# Patient Record
Sex: Male | Born: 1990 | Race: Black or African American | Hispanic: No | Marital: Single | Smoking: Current every day smoker
Health system: Southern US, Community
[De-identification: ages and names within clinical notes are randomized; demographics above are authoritative.]

## PROBLEM LIST (undated history)

## (undated) DIAGNOSIS — S63056A Dislocation of other carpometacarpal joint of unspecified hand, initial encounter: Secondary | ICD-10-CM

## (undated) DIAGNOSIS — S62309A Unspecified fracture of unspecified metacarpal bone, initial encounter for closed fracture: Secondary | ICD-10-CM

---

## 2016-12-27 DIAGNOSIS — S63056A Dislocation of other carpometacarpal joint of unspecified hand, initial encounter: Secondary | ICD-10-CM

## 2016-12-27 DIAGNOSIS — S62309A Unspecified fracture of unspecified metacarpal bone, initial encounter for closed fracture: Secondary | ICD-10-CM

## 2016-12-27 HISTORY — DX: Dislocation of other carpometacarpal joint of unspecified hand, initial encounter: S63.056A

## 2016-12-27 HISTORY — DX: Unspecified fracture of unspecified metacarpal bone, initial encounter for closed fracture: S62.309A

## 2017-01-04 ENCOUNTER — Other Ambulatory Visit: Payer: Self-pay | Admitting: Orthopedic Surgery

## 2017-01-04 ENCOUNTER — Encounter (HOSPITAL_BASED_OUTPATIENT_CLINIC_OR_DEPARTMENT_OTHER): Payer: Self-pay | Admitting: *Deleted

## 2017-01-05 ENCOUNTER — Ambulatory Visit (HOSPITAL_BASED_OUTPATIENT_CLINIC_OR_DEPARTMENT_OTHER)
Admission: RE | Admit: 2017-01-05 | Discharge: 2017-01-05 | Disposition: A | Discharge status: Home / Self Care | Payer: Self-pay | Source: Ambulatory Visit | Attending: Orthopedic Surgery | Admitting: Orthopedic Surgery

## 2017-01-05 ENCOUNTER — Ambulatory Visit (HOSPITAL_BASED_OUTPATIENT_CLINIC_OR_DEPARTMENT_OTHER): Payer: Self-pay | Admitting: Anesthesiology

## 2017-01-05 ENCOUNTER — Encounter (HOSPITAL_BASED_OUTPATIENT_CLINIC_OR_DEPARTMENT_OTHER): Payer: Self-pay

## 2017-01-05 ENCOUNTER — Ambulatory Visit (HOSPITAL_COMMUNITY): Payer: Self-pay

## 2017-01-05 ENCOUNTER — Encounter (HOSPITAL_BASED_OUTPATIENT_CLINIC_OR_DEPARTMENT_OTHER)
Admission: RE | Disposition: A | Discharge status: Home / Self Care | Payer: Self-pay | Source: Ambulatory Visit | Attending: Orthopedic Surgery

## 2017-01-05 DIAGNOSIS — F17210 Nicotine dependence, cigarettes, uncomplicated: Secondary | ICD-10-CM | POA: Insufficient documentation

## 2017-01-05 DIAGNOSIS — T148XXA Other injury of unspecified body region, initial encounter: Secondary | ICD-10-CM

## 2017-01-05 DIAGNOSIS — Y929 Unspecified place or not applicable: Secondary | ICD-10-CM | POA: Insufficient documentation

## 2017-01-05 DIAGNOSIS — S63054A Dislocation of other carpometacarpal joint of right hand, initial encounter: Secondary | ICD-10-CM | POA: Insufficient documentation

## 2017-01-05 DIAGNOSIS — W2209XA Striking against other stationary object, initial encounter: Secondary | ICD-10-CM | POA: Insufficient documentation

## 2017-01-05 DIAGNOSIS — S62324A Displaced fracture of shaft of fourth metacarpal bone, right hand, initial encounter for closed fracture: Secondary | ICD-10-CM | POA: Insufficient documentation

## 2017-01-05 HISTORY — PX: OPEN REDUCTION INTERNAL FIXATION (ORIF) METACARPAL: SHX6234

## 2017-01-05 HISTORY — DX: Dislocation of other carpometacarpal joint of unspecified hand, initial encounter: S63.056A

## 2017-01-05 HISTORY — DX: Unspecified fracture of unspecified metacarpal bone, initial encounter for closed fracture: S62.309A

## 2017-01-05 SURGERY — OPEN REDUCTION INTERNAL FIXATION (ORIF) METACARPAL
Anesthesia: General | Site: Hand | Laterality: Right

## 2017-01-05 MED ORDER — BUPIVACAINE-EPINEPHRINE 0.5% -1:200000 IJ SOLN
INTRAMUSCULAR | Status: DC | PRN
  Administered 2017-01-05: 5 mL

## 2017-01-05 MED ORDER — HYDROMORPHONE HCL 1 MG/ML IJ SOLN: 0.2500 mg | INTRAMUSCULAR | Status: DC | PRN

## 2017-01-05 MED ORDER — KETOROLAC TROMETHAMINE 30 MG/ML IJ SOLN: 30.0000 mg | Freq: Once | INTRAMUSCULAR | Status: DC | PRN

## 2017-01-05 MED ORDER — PROPOFOL 10 MG/ML IV BOLUS
INTRAVENOUS | Status: DC | PRN
  Administered 2017-01-05: 200 mg via INTRAVENOUS

## 2017-01-05 MED ORDER — CEFAZOLIN SODIUM-DEXTROSE 2-4 GM/100ML-% IV SOLN: 2.0000 g | INTRAVENOUS | Status: DC

## 2017-01-05 MED ORDER — CEFAZOLIN SODIUM-DEXTROSE 2-4 GM/100ML-% IV SOLN
INTRAVENOUS | Status: AC
  Filled 2017-01-05: qty 100

## 2017-01-05 MED ORDER — 0.9 % SODIUM CHLORIDE (POUR BTL) OPTIME
TOPICAL | Status: DC | PRN
  Administered 2017-01-05: 200 mL

## 2017-01-05 MED ORDER — DEXAMETHASONE SODIUM PHOSPHATE 4 MG/ML IJ SOLN
INTRAMUSCULAR | Status: DC | PRN
  Administered 2017-01-05: 10 mg via INTRAVENOUS

## 2017-01-05 MED ORDER — ACETAMINOPHEN 325 MG PO TABS: 650.0000 mg | ORAL_TABLET | Freq: Four times a day (QID) | ORAL | Status: AC | PRN

## 2017-01-05 MED ORDER — ONDANSETRON HCL 4 MG/2ML IJ SOLN
INTRAMUSCULAR | Status: AC
  Filled 2017-01-05: qty 2

## 2017-01-05 MED ORDER — MIDAZOLAM HCL 2 MG/2ML IJ SOLN
INTRAMUSCULAR | Status: AC
  Filled 2017-01-05: qty 2

## 2017-01-05 MED ORDER — ACETAMINOPHEN 10 MG/ML IV SOLN: 1000.0000 mg | Freq: Once | INTRAVENOUS | Status: DC | PRN

## 2017-01-05 MED ORDER — LIDOCAINE HCL (PF) 1 % IJ SOLN
INTRAMUSCULAR | Status: DC | PRN
  Administered 2017-01-05: 5 mL

## 2017-01-05 MED ORDER — DEXAMETHASONE SODIUM PHOSPHATE 10 MG/ML IJ SOLN
INTRAMUSCULAR | Status: AC
  Filled 2017-01-05: qty 1

## 2017-01-05 MED ORDER — OXYCODONE HCL 5 MG/5ML PO SOLN: 5.0000 mg | Freq: Once | ORAL | Status: DC | PRN

## 2017-01-05 MED ORDER — FENTANYL CITRATE (PF) 100 MCG/2ML IJ SOLN
50.0000 ug | INTRAMUSCULAR | Status: DC | PRN
  Administered 2017-01-05: 100 ug via INTRAVENOUS

## 2017-01-05 MED ORDER — OXYCODONE HCL 5 MG PO TABS: 5.0000 mg | ORAL_TABLET | Freq: Once | ORAL | Status: DC | PRN

## 2017-01-05 MED ORDER — OXYCODONE HCL 5 MG PO TABS: 5.0000 mg | ORAL_TABLET | Freq: Four times a day (QID) | ORAL | 0 refills | Status: AC | PRN

## 2017-01-05 MED ORDER — IBUPROFEN 200 MG PO TABS: 600.0000 mg | ORAL_TABLET | Freq: Four times a day (QID) | ORAL | Status: AC | PRN

## 2017-01-05 MED ORDER — LACTATED RINGERS IV SOLN
INTRAVENOUS | Status: DC
  Administered 2017-01-05: 13:00:00 via INTRAVENOUS

## 2017-01-05 MED ORDER — LIDOCAINE HCL (PF) 1 % IJ SOLN
INTRAMUSCULAR | Status: AC
  Filled 2017-01-05: qty 30

## 2017-01-05 MED ORDER — LACTATED RINGERS IV SOLN
INTRAVENOUS | Status: DC
  Administered 2017-01-05: 15:00:00 via INTRAVENOUS

## 2017-01-05 MED ORDER — LIDOCAINE HCL (CARDIAC) 20 MG/ML IV SOLN
INTRAVENOUS | Status: AC
  Filled 2017-01-05: qty 5

## 2017-01-05 MED ORDER — SCOPOLAMINE 1 MG/3DAYS TD PT72: 1.0000 | MEDICATED_PATCH | Freq: Once | TRANSDERMAL | Status: DC | PRN

## 2017-01-05 MED ORDER — LIDOCAINE 2% (20 MG/ML) 5 ML SYRINGE
INTRAMUSCULAR | Status: DC | PRN
  Administered 2017-01-05: 100 mg via INTRAVENOUS

## 2017-01-05 MED ORDER — FENTANYL CITRATE (PF) 100 MCG/2ML IJ SOLN
INTRAMUSCULAR | Status: AC
  Filled 2017-01-05: qty 2

## 2017-01-05 MED ORDER — MIDAZOLAM HCL 2 MG/2ML IJ SOLN
1.0000 mg | INTRAMUSCULAR | Status: DC | PRN
  Administered 2017-01-05: 2 mg via INTRAVENOUS

## 2017-01-05 MED ORDER — PROMETHAZINE HCL 25 MG/ML IJ SOLN: 6.2500 mg | INTRAMUSCULAR | Status: DC | PRN

## 2017-01-05 MED ORDER — MEPERIDINE HCL 25 MG/ML IJ SOLN: 6.2500 mg | INTRAMUSCULAR | Status: DC | PRN

## 2017-01-05 SURGICAL SUPPLY — 48 items
BLADE MINI RND TIP GREEN BEAV (BLADE) IMPLANT
BLADE SURG 15 STRL LF DISP TIS (BLADE) ×1 IMPLANT
BLADE SURG 15 STRL SS (BLADE) ×2
BNDG COHESIVE 4X5 TAN STRL (GAUZE/BANDAGES/DRESSINGS) ×3 IMPLANT
BNDG ESMARK 4X9 LF (GAUZE/BANDAGES/DRESSINGS) IMPLANT
BNDG GAUZE ELAST 4 BULKY (GAUZE/BANDAGES/DRESSINGS) ×3 IMPLANT
CANISTER SUCTION 1200CC (MISCELLANEOUS) IMPLANT
CHLORAPREP W/TINT 26ML (MISCELLANEOUS) ×3 IMPLANT
CORD BIPOLAR FORCEPS 12FT (ELECTRODE) ×3 IMPLANT
COVER BACK TABLE 60X90IN (DRAPES) ×3 IMPLANT
COVER MAYO STAND STRL (DRAPES) ×3 IMPLANT
CUFF TOURNIQUET SINGLE 18IN (TOURNIQUET CUFF) ×3 IMPLANT
DRAPE C-ARM 42X72 X-RAY (DRAPES) ×3 IMPLANT
DRAPE EXTREMITY T 121X128X90 (DRAPE) ×3 IMPLANT
DRAPE SURG 17X23 STRL (DRAPES) ×3 IMPLANT
DRSG EMULSION OIL 3X3 NADH (GAUZE/BANDAGES/DRESSINGS) ×3 IMPLANT
GAUZE SPONGE 4X4 12PLY STRL LF (GAUZE/BANDAGES/DRESSINGS) ×3 IMPLANT
GLOVE BIO SURGEON STRL SZ7.5 (GLOVE) ×3 IMPLANT
GLOVE BIOGEL PI IND STRL 7.0 (GLOVE) ×1 IMPLANT
GLOVE BIOGEL PI IND STRL 8 (GLOVE) ×1 IMPLANT
GLOVE BIOGEL PI INDICATOR 7.0 (GLOVE) ×2
GLOVE BIOGEL PI INDICATOR 8 (GLOVE) ×2
GLOVE ECLIPSE 6.5 STRL STRAW (GLOVE) ×3 IMPLANT
GOWN STRL REUS W/ TWL LRG LVL3 (GOWN DISPOSABLE) ×1 IMPLANT
GOWN STRL REUS W/TWL LRG LVL3 (GOWN DISPOSABLE) ×2
GOWN STRL REUS W/TWL XL LVL3 (GOWN DISPOSABLE) ×3 IMPLANT
K-WIRE .062X4 (WIRE) ×6 IMPLANT
NEEDLE HYPO 25X1 1.5 SAFETY (NEEDLE) ×3 IMPLANT
NS IRRIG 1000ML POUR BTL (IV SOLUTION) ×3 IMPLANT
PACK BASIN DAY SURGERY FS (CUSTOM PROCEDURE TRAY) ×3 IMPLANT
PADDING CAST ABS 4INX4YD NS (CAST SUPPLIES) ×2
PADDING CAST ABS COTTON 4X4 ST (CAST SUPPLIES) ×1 IMPLANT
SLEEVE SCD COMPRESS KNEE MED (MISCELLANEOUS) IMPLANT
SPLINT PLASTER CAST XFAST 3X15 (CAST SUPPLIES) ×8 IMPLANT
SPLINT PLASTER XTRA FASTSET 3X (CAST SUPPLIES) ×16
STOCKINETTE 6  STRL (DRAPES) ×2
STOCKINETTE 6 STRL (DRAPES) ×1 IMPLANT
SUCTION FRAZIER HANDLE 10FR (MISCELLANEOUS)
SUCTION TUBE FRAZIER 10FR DISP (MISCELLANEOUS) IMPLANT
SUT VICRYL RAPIDE 4-0 (SUTURE) IMPLANT
SUT VICRYL RAPIDE 4/0 PS 2 (SUTURE) ×3 IMPLANT
SYR 10ML LL (SYRINGE) ×3 IMPLANT
SYR BULB 3OZ (MISCELLANEOUS) ×3 IMPLANT
TOWEL OR 17X24 6PK STRL BLUE (TOWEL DISPOSABLE) ×3 IMPLANT
TOWEL OR NON WOVEN STRL DISP B (DISPOSABLE) ×3 IMPLANT
TUBE CONNECTING 20'X1/4 (TUBING)
TUBE CONNECTING 20X1/4 (TUBING) IMPLANT
UNDERPAD 30X30 (UNDERPADS AND DIAPERS) ×3 IMPLANT

## 2017-01-05 NOTE — Anesthesia Procedure Notes (Signed)
Procedure Name: LMA Insertion Date/Time: 01/05/2017 3:03 PM Performed by: Gar GibbonKEETON, Iyania Denne S Pre-anesthesia Checklist: Patient identified, Emergency Drugs available, Suction available and Patient being monitored Patient Re-evaluated:Patient Re-evaluated prior to inductionOxygen Delivery Method: Circle system utilized Preoxygenation: Pre-oxygenation with 100% oxygen Intubation Type: IV induction Ventilation: Mask ventilation without difficulty LMA: LMA inserted LMA Size: 4.0 Number of attempts: 1 Airway Equipment and Method: Bite block Placement Confirmation: positive ETCO2 Tube secured with: Tape Dental Injury: Teeth and Oropharynx as per pre-operative assessment

## 2017-01-05 NOTE — Interval H&P Note (Signed)
History and Physical Interval Note:  01/05/2017 1:18 PM  Richard Huynh  has presented today for surgery, with the diagnosis of RIGHT 4TH METACARPAL FRACTURE, 5TH [facility] S62.324A, [phone]  The various methods of treatment have been discussed with the patient and family. After consideration of risks, benefits and other options for treatment, the patient has consented to  Procedure(s): OPEN TREATMENT OF RIGHT 4TH METACARPAL FRACTURE, 5TH CMC DISLOCATION (Right) as a surgical intervention .  The patient's history has been reviewed, patient examined, no change in status, stable for surgery.  I have reviewed the patient's chart and labs.  Questions were answered to the patient's satisfaction.     Vilma Will A.

## 2017-01-05 NOTE — Op Note (Signed)
01/05/2017  2:36 PM  PATIENT:  Richard Huynh  26 y.o. male  PRE-OPERATIVE DIAGNOSIS:  R 4 MC fx and 5 CMC dx  POST-OPERATIVE DIAGNOSIS:  Same  PROCEDURE:  ORIF R 4 MC fx (IMN) and ORIF R 5 CMC dx  SURGEON: Cliffton Asters. Janee Morn, MD  PHYSICIAN ASSISTANT: None  ANESTHESIA:  general  SPECIMENS:  None  DRAINS:   None  EBL:  less than 50 mL  PREOPERATIVE INDICATIONS:  [name] is a  26 y.o. male with a R 4 MC fx and 5 CMC dx   The risks benefits and alternatives were discussed with the patient preoperatively including but not limited to the risks of infection, bleeding, nerve injury, cardiopulmonary complications, the need for revision surgery, among others, and the patient verbalized understanding and consented to proceed.  OPERATIVE IMPLANTS: 0.062 in k-wires   OPERATIVE PROCEDURE:  After receiving prophylactic antibiotics, the patient was escorted to the operative theatre and placed in a supine position.  General anesthesia was administered.  A surgical "time-out" was performed during which the planned procedure, proposed operative site, and the correct patient identity were compared to the operative consent and agreement confirmed by the circulating nurse according to current facility policy.  Following application of a tourniquet to the operative extremity, the exposed skin was prepped with Chloraprep and draped in the usual sterile fashion.   First, I addressed the fourth metacarpal fracture.  The base of the metacarpal was marked fluoroscopically and a small incision just a few millimeters in length was made through the skin.  Hemostat dissection was carried down to the dorsal surface of the fourth metacarpal were 0.062 inch K wire was used to create an oblique entry hole.  The K wire was then removed, the tip clipped off and bent into a skid shape and then advanced by hand in a 3. shock to the fracture site.  Attempts at closed reduction and passage of the intramedullary nail  proved unsuccessful and a small incision was made over the fracture site, where open reduction was performed.  A phalangeal clamp was used to hold the reduction and the nail was passed across the fracture site.  Alignment was excellent.  The nail was advanced into the head of the metacarpal and then was bent over 90/90 at the skin and clipped.  Attention was shifted to the fifth CMC dislocation.  Closed reduction was unsuccessful.  I made a small stab wound through which a couple of instruments were past trying to assist in reduction, and this was unsuccessful.  The tourniquet was then inflated to 250 mmHg and the small wound was extended to be about an inch long.  The [facility] joint was visualized directly and some of the dorsal capsule was pulled out of it, which allowed for reduction of the joint.  The reduction appeared to be anatomic.  It was secured with a 0.062 inch K wire driven from the base of the fifth into the hamate.  It was bent over at the skin and clipped.  Final fluoroscopic images were obtained.  The tourniquet was released, the wound irrigated, and the skin closed with 4-0 Vicryl Rapide interrupted and running suture.  A short arm splint dressing was applied with a volar plaster component and I performed an ulnar nerve block at the wrist to assist with postoperative pain control.  He was awakened and taken to the recovery room in stable condition, breathing spontaneously  DISPOSITION: He'll be discharged home today with  typical instructions, returning in 7-10 days, at which time he should have new x-rays of the right hand out of splint and conversion to short arm cast.

## 2017-01-05 NOTE — Transfer of Care (Signed)
Immediate Anesthesia Transfer of Care Note  Patient: Richard Huynh  Procedure(s) Performed: Procedure(s): OPEN TREATMENT OF RIGHT FOURTH METACARPAL FRACTURE, FIFTH CARPOMETACARPAL DISLOCATION (Right)  Patient Location: PACU  Anesthesia Type:General  Level of Consciousness: sedated and responds to stimulation  Airway & Oxygen Therapy: Patient Spontanous Breathing and Patient connected to face mask oxygen  Post-op Assessment: Report given to RN and Post -op Vital signs reviewed and stable  Post vital signs: Reviewed and stable  Last Vitals:  Vitals:   01/05/17 1247  BP: 108/70  Pulse: 65  Resp: 18  Temp: 36.8 C    Last Pain:  Vitals:   01/05/17 1318  PainSc: 6       Patients Stated Pain Goal: 1 (01/05/17 1318)  Complications: No apparent anesthesia complications

## 2017-01-05 NOTE — Discharge Instructions (Addendum)
Discharge Instructions ° ° °You have a dressing with a plaster splint incorporated in it. °Move your fingers as much as possible, making a full fist and fully opening the fist. °Elevate your hand to reduce pain & swelling of the digits.  Ice over the operative site may be helpful to reduce pain & swelling.  DO NOT USE HEAT. °Leave the dressing in place until you return to our office.  °You may shower, but keep the bandage clean & dry.  °You may drive a car when you are off of prescription pain medications and can safely control your vehicle with both hands. ° ° °Please call [phone] during normal business hours or [phone] after hours for any problems. Including the following: ° °- excessive redness of the incisions °- drainage for more than 4 days °- fever of more than 101.5 F ° °*Please note that pain medications will not be refilled after hours or on weekends. ° ° ° °Post Anesthesia Home Care Instructions ° °Activity: °Get plenty of rest for the remainder of the day. A responsible individual must stay with you for 24 hours following the procedure.  °For the next 24 hours, DO NOT: °-Drive a car °-Operate machinery °-Drink alcoholic beverages °-Take any medication unless instructed by your physician °-Make any legal decisions or sign important papers. ° °Meals: °Start with liquid foods such as gelatin or soup. Progress to regular foods as tolerated. Avoid greasy, spicy, heavy foods. If nausea and/or vomiting occur, drink only clear liquids until the nausea and/or vomiting subsides. Call your physician if vomiting continues. ° °Special Instructions/Symptoms: °Your throat may feel dry or sore from the anesthesia or the breathing tube placed in your throat during surgery. If this causes discomfort, gargle with warm salt water. The discomfort should disappear within 24 hours. ° °If you had a scopolamine patch placed behind your ear for the management of post- operative nausea and/or vomiting: ° °1. The  medication in the patch is effective for 72 hours, after which it should be removed.  Wrap patch in a tissue and discard in the trash. Wash hands thoroughly with soap and water. °2. You may remove the patch earlier than 72 hours if you experience unpleasant side effects which may include dry mouth, dizziness or visual disturbances. °3. Avoid touching the patch. Wash your hands with soap and water after contact with the patch. °  ° ° °

## 2017-01-05 NOTE — Anesthesia Preprocedure Evaluation (Signed)
Anesthesia Evaluation  Patient identified by MRN, date of birth, ID band Patient awake    Reviewed: Allergy & Precautions, NPO status , Patient's Chart, lab work & pertinent test results  Airway Mallampati: II  TM Distance: >3 FB Neck ROM: Full    Dental no notable dental hx.    Pulmonary Current Smoker,    Pulmonary exam normal breath sounds clear to auscultation       Cardiovascular negative cardio ROS Normal cardiovascular exam Rhythm:Regular Rate:Normal     Neuro/Psych negative neurological ROS  negative psych ROS   GI/Hepatic negative GI ROS, Neg liver ROS,   Endo/Other  negative endocrine ROS  Renal/GU negative Renal ROS     Musculoskeletal negative musculoskeletal ROS (+)   Abdominal   Peds  Hematology negative hematology ROS (+)   Anesthesia Other Findings   Reproductive/Obstetrics                             Anesthesia Physical Anesthesia Plan  ASA: II  Anesthesia Plan: General   Post-op Pain Management:    Induction: Intravenous  PONV Risk Score and Plan: 3 and Ondansetron, Dexamethasone, Propofol and Midazolam  Airway Management Planned: LMA  Additional Equipment:   Intra-op Plan:   Post-operative Plan: Extubation in OR  Informed Consent: I have reviewed the patients History and Physical, chart, labs and discussed the procedure including the risks, benefits and alternatives for the proposed anesthesia with the patient or authorized representative who has indicated his/her understanding and acceptance.   Dental advisory given  Plan Discussed with: CRNA  Anesthesia Plan Comments:         Anesthesia Quick Evaluation

## 2017-01-05 NOTE — H&P (Signed)
Richard Huynh is an 26 y.o. male.   CC / Reason for Visit: Right hand injury HPI: This patient presents for evaluation of his right hand.  It is my understanding that he injured it striking an immovable object.  [facility], referred for outpatient follow-up, returned to the emergency room, unable to obtain that follow-up and I was consulted when [facility] past Saturday.  He presents with a splint in place.  Past Medical History:  Diagnosis Date  . CMC (carpometacarpal joint) dislocation 12/2016   right 5th  . Metacarpal bone fracture 12/2016   right 4th    History reviewed. No pertinent surgical history.  History reviewed. No pertinent family history. Social History:  reports that he has been smoking Cigarettes.  He has a 10.00 pack-year smoking history. He has never used smokeless tobacco. He reports that he drinks alcohol. He reports that he does not use drugs.  Allergies: No Known Allergies  No prescriptions prior to admission.    No results found for this or any previous visit (from the past 48 hour(s)). No results found.  Review of Systems  All other systems reviewed and are negative.   Height 5\' 9"  (1.753 m), weight 79.4 kg (175 lb). Physical Exam  Constitutional:  WD, WN, NAD HEENT:  NCAT, EOMI Neuro/Psych:  Alert & oriented to person, place, and time; appropriate mood & affect Lymphatic: No generalized UE edema or lymphadenopathy Extremities / MSK:  Both UE are normal with respect to appearance, ranges of motion, joint stability, muscle strength/tone, sensation, & perfusion except as otherwise noted:  Right hand is in an ulnar gutter splint.  The tips of the digits are accessible, warm with brisk capillary refill, intact light touch sensibility across all of the tips.  Labs / Xrays:  No radiographic studies obtained today.  X-rays from 01-02-17 are reviewed on canopy and reveal a fourth metacarpal  midshaft fracture and a 5th CMC dislocation  Assessment: Subacute right hand injury, with fourth metacarpal shaft fracture and fifth CMC dislocation  Plan:  I discussed these findings with him and recommended operative treatment.  With regard to the fourth, hopefully I can do this with an intramedullary K wire.  We discussed reducing and pinning the fifth CMC as well.  This is tentatively scheduled for tomorrow.  The details of the operative procedure were discussed with the patient.  Questions were invited and answered.  In addition to the goal of the procedure, the risks of the procedure to include but not limited to bleeding; infection; damage to the nerves or blood vessels that could result in bleeding, numbness, weakness, chronic pain, and the need for additional procedures; stiffness; the need for revision surgery; and anesthetic risks were reviewed.  No specific outcome was guaranteed or implied.  Informed consent was obtained.  Richard Huynh A., MD 01/05/2017, 6:54 AM

## 2017-01-06 ENCOUNTER — Encounter (HOSPITAL_BASED_OUTPATIENT_CLINIC_OR_DEPARTMENT_OTHER): Payer: Self-pay | Admitting: Orthopedic Surgery

## 2017-01-06 NOTE — Anesthesia Postprocedure Evaluation (Signed)
Anesthesia Post Note  Patient: Richard Huynh  Procedure(s) Performed: Procedure(s) (LRB): OPEN TREATMENT OF RIGHT FOURTH METACARPAL FRACTURE, FIFTH CARPOMETACARPAL DISLOCATION (Right)     Patient location during evaluation: PACU Anesthesia Type: General Level of consciousness: sedated and patient cooperative Pain management: pain level controlled Vital Signs Assessment: post-procedure vital signs reviewed and stable Respiratory status: spontaneous breathing Cardiovascular status: stable Anesthetic complications: no    Last Vitals:  Vitals:   01/05/17 1645 01/05/17 1716  BP: 118/77 123/89  Pulse: (!) 58 (!) 56  Resp: 18 20  Temp:  36.4 C    Last Pain:  Vitals:   01/06/17 1112  PainSc: 0-No pain                 Lewie LoronJohn Dannae Kato

## 2017-12-16 ENCOUNTER — Ambulatory Visit (HOSPITAL_COMMUNITY)
Admission: RE | Admit: 2017-12-16 | Discharge: 2017-12-16 | Disposition: A | Discharge status: Home / Self Care | Payer: Self-pay | Source: Ambulatory Visit | Attending: Obstetrics & Gynecology | Admitting: Obstetrics & Gynecology

## 2017-12-16 DIAGNOSIS — Z029 Encounter for administrative examinations, unspecified: Secondary | ICD-10-CM | POA: Insufficient documentation

## 2018-07-02 IMAGING — RF DG C-ARM 61-120 MIN
1 series · 4 of 4 positions shown · non-contrast
Comparison: 12/30/2016.

CLINICAL DATA: 25-year-old male with right fourth metacarpal
fracture and right fifth metacarpal subluxation. Subsequent
encounter.

EXAM:
DG C-ARM 61-120 MIN; RIGHT HAND - COMPLETE 3+ VIEW

[Series 1: run · 4 of 4 slices shown]
[im 1/4]
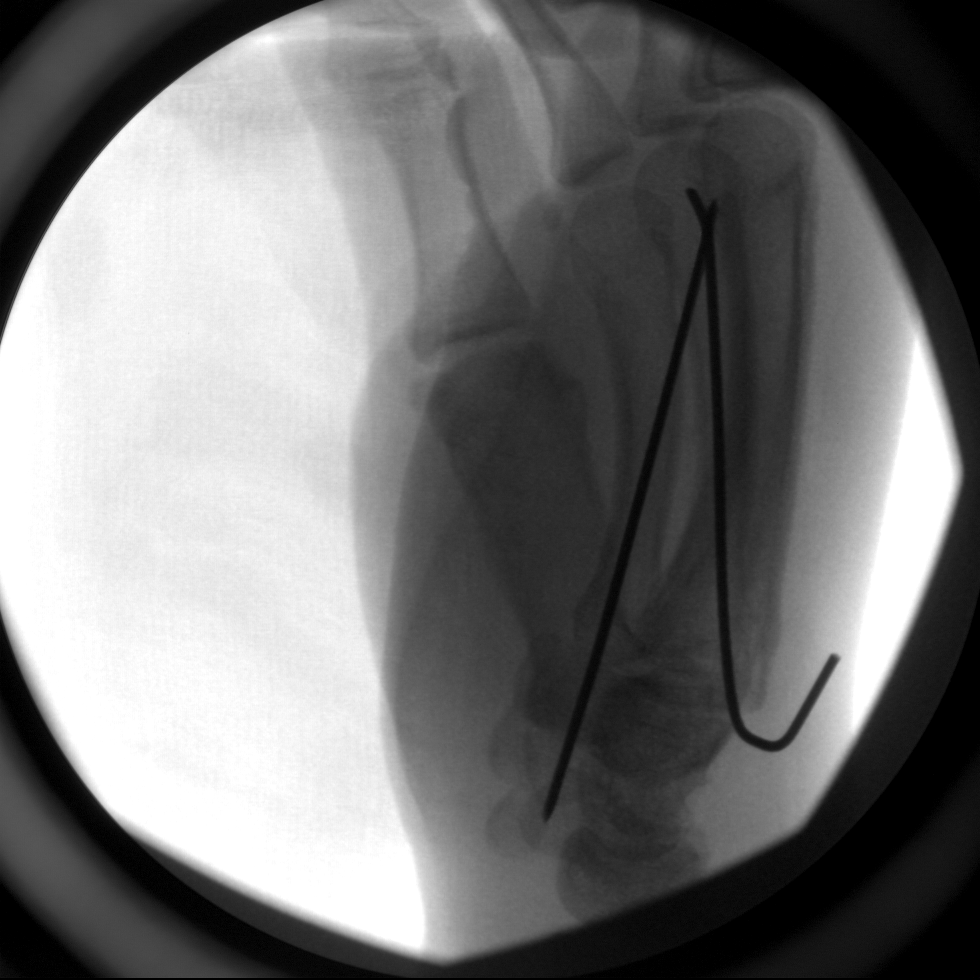
[im 2/4]
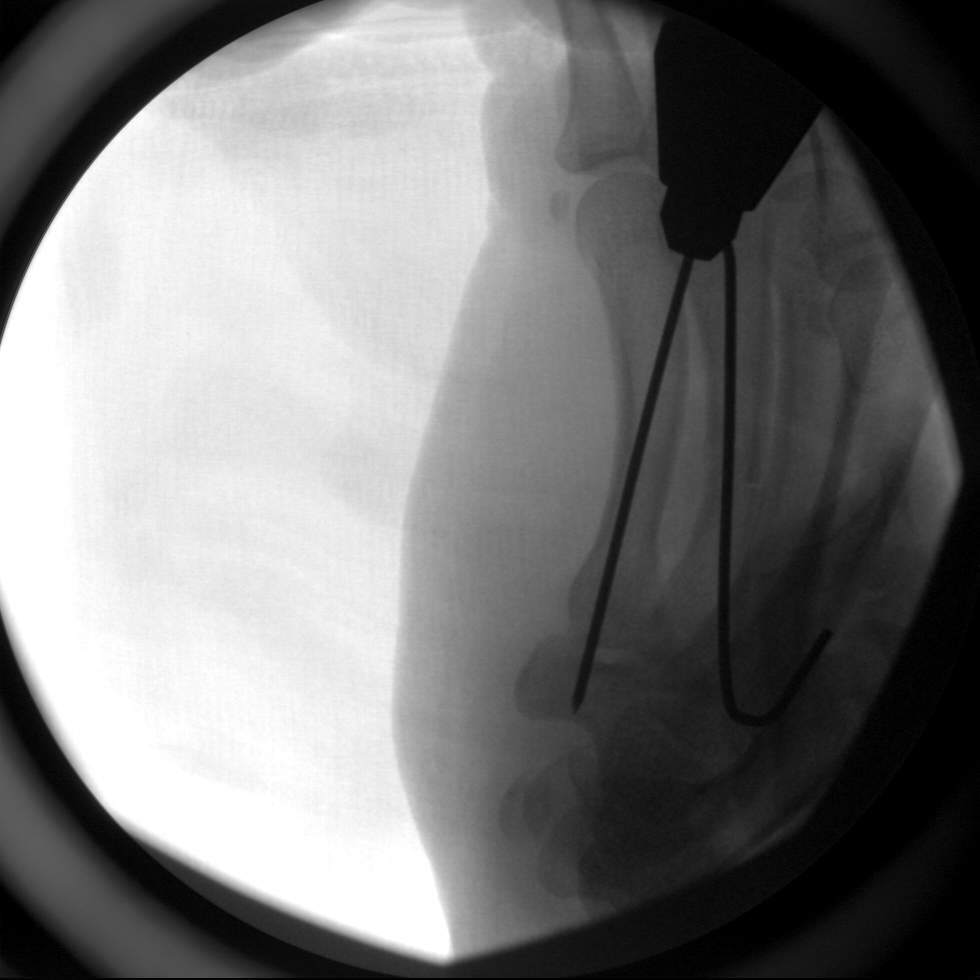
[im 3/4]
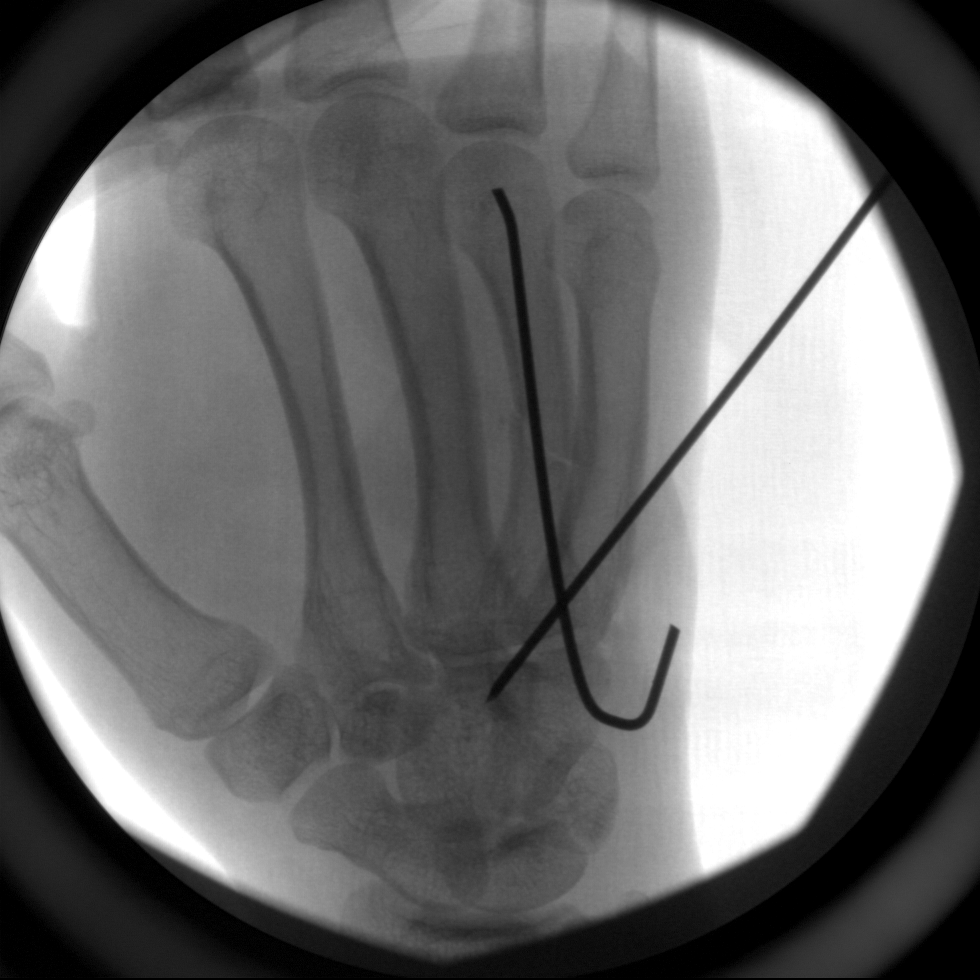
[im 4/4]
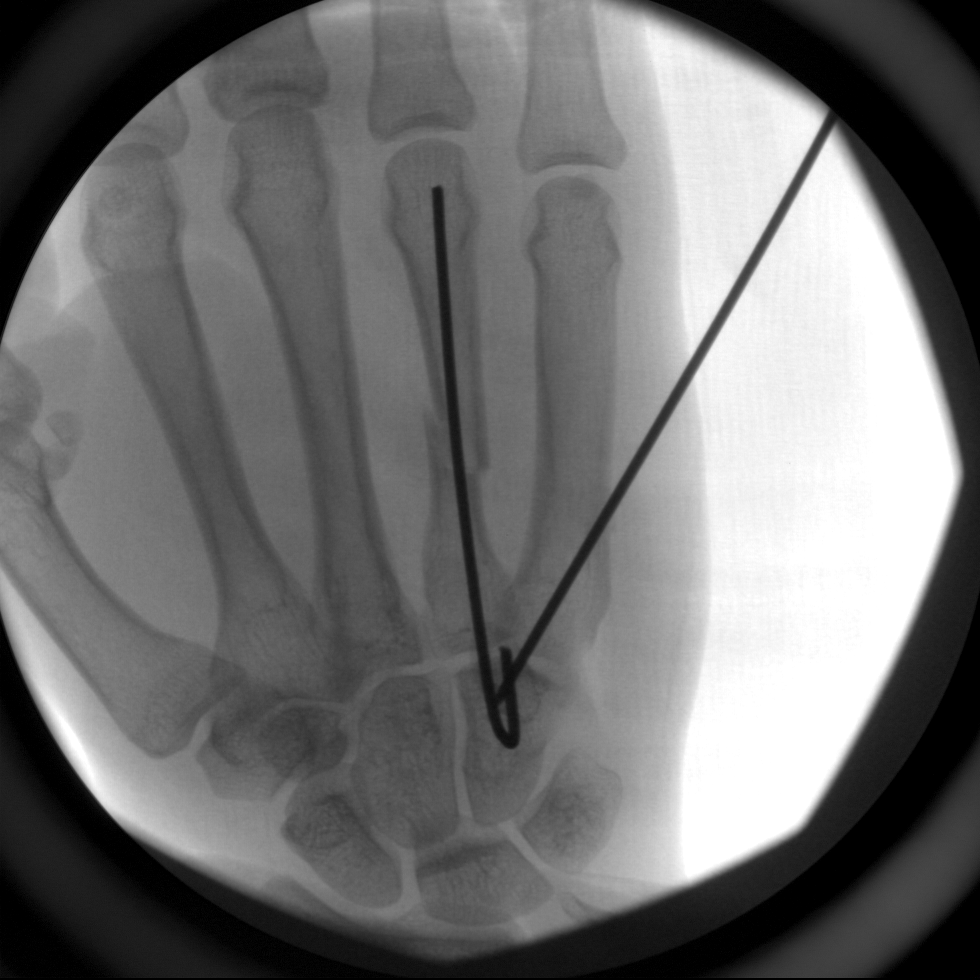

[4 of 4 positions shown; findings below may reference images not displayed]

FINDINGS: Four intraoperative C-arm views submitted for review after surgery.

Wire utilized to transfix right fourth metacarpal shaft fracture.
There remains slight incongruity of the fracture fragments.

Wire traverses the base of the right fifth metacarpal and hamate
which are better aligned.
IMPRESSION: Open reduction and internal fixation of fracture right fourth
metacarpal and subluxation right fifth metacarpal.
